# Patient Record
Sex: Female | Born: 1955 | Race: White | Hispanic: No | Marital: Married | Smoking: Never smoker
Health system: Southern US, Community
[De-identification: ages and names within clinical notes are randomized; demographics above are authoritative.]

---

## 2005-09-04 ENCOUNTER — Other Ambulatory Visit: Admission: RE | Admit: 2005-09-04 | Discharge: 2005-09-04 | Payer: Self-pay | Admitting: Gynecology

## 2005-09-11 ENCOUNTER — Encounter: Admission: RE | Admit: 2005-09-11 | Discharge: 2005-09-11 | Payer: Self-pay | Admitting: Gynecology

## 2005-10-24 ENCOUNTER — Encounter: Admission: RE | Admit: 2005-10-24 | Discharge: 2005-12-10 | Payer: Self-pay | Admitting: Family Medicine

## 2006-09-08 ENCOUNTER — Other Ambulatory Visit: Admission: RE | Admit: 2006-09-08 | Discharge: 2006-09-08 | Payer: Self-pay | Admitting: Gynecology

## 2006-09-12 ENCOUNTER — Encounter: Admission: RE | Admit: 2006-09-12 | Discharge: 2006-09-12 | Payer: Self-pay | Admitting: Gynecology

## 2007-09-16 ENCOUNTER — Encounter: Admission: RE | Admit: 2007-09-16 | Discharge: 2007-09-16 | Payer: Self-pay | Admitting: Gynecology

## 2007-09-17 ENCOUNTER — Other Ambulatory Visit: Admission: RE | Admit: 2007-09-17 | Discharge: 2007-09-17 | Payer: Self-pay | Admitting: Gynecology

## 2008-02-08 ENCOUNTER — Encounter (INDEPENDENT_AMBULATORY_CARE_PROVIDER_SITE_OTHER): Payer: Self-pay | Admitting: Obstetrics and Gynecology

## 2008-02-08 ENCOUNTER — Ambulatory Visit (HOSPITAL_COMMUNITY): Admission: RE | Admit: 2008-02-08 | Discharge: 2008-02-08 | Payer: Self-pay | Admitting: Obstetrics and Gynecology

## 2008-10-18 ENCOUNTER — Encounter: Admission: RE | Admit: 2008-10-18 | Discharge: 2008-10-18 | Payer: Self-pay | Admitting: Obstetrics and Gynecology

## 2008-10-21 ENCOUNTER — Encounter: Admission: RE | Admit: 2008-10-21 | Discharge: 2008-10-21 | Payer: Self-pay | Admitting: Obstetrics and Gynecology

## 2009-10-24 ENCOUNTER — Encounter: Admission: RE | Admit: 2009-10-24 | Discharge: 2009-10-24 | Payer: Self-pay | Admitting: Obstetrics and Gynecology

## 2010-07-15 ENCOUNTER — Encounter: Payer: Self-pay | Admitting: Obstetrics and Gynecology

## 2010-10-08 ENCOUNTER — Other Ambulatory Visit: Payer: Self-pay | Admitting: Obstetrics and Gynecology

## 2010-10-08 DIAGNOSIS — Z1231 Encounter for screening mammogram for malignant neoplasm of breast: Secondary | ICD-10-CM

## 2010-10-30 ENCOUNTER — Other Ambulatory Visit: Payer: Self-pay | Admitting: Obstetrics and Gynecology

## 2010-10-30 ENCOUNTER — Ambulatory Visit
Admission: RE | Admit: 2010-10-30 | Discharge: 2010-10-30 | Disposition: A | Discharge status: Home / Self Care | Payer: Federal, State, Local not specified - PPO | Source: Ambulatory Visit | Attending: Obstetrics and Gynecology | Admitting: Obstetrics and Gynecology

## 2010-10-30 DIAGNOSIS — Z1231 Encounter for screening mammogram for malignant neoplasm of breast: Secondary | ICD-10-CM

## 2010-10-30 DIAGNOSIS — N63 Unspecified lump in unspecified breast: Secondary | ICD-10-CM

## 2010-11-05 ENCOUNTER — Other Ambulatory Visit: Payer: Self-pay | Admitting: Obstetrics and Gynecology

## 2010-11-05 ENCOUNTER — Ambulatory Visit
Admission: RE | Admit: 2010-11-05 | Discharge: 2010-11-05 | Disposition: A | Discharge status: Home / Self Care | Payer: Federal, State, Local not specified - PPO | Source: Ambulatory Visit | Attending: Obstetrics and Gynecology | Admitting: Obstetrics and Gynecology

## 2010-11-05 DIAGNOSIS — N63 Unspecified lump in unspecified breast: Secondary | ICD-10-CM

## 2010-11-06 NOTE — Op Note (Signed)
[name], [name]               ACCOUNT NO.:  [id number]   MEDICAL RECORD NO.:  [id number]          PATIENT TYPE:  AMB   LOCATION:  SDC                           FACILITY:  [facility]   PHYSICIAN:  Sherron Monday, MD        DATE OF BIRTH:  [birth year]   DATE OF PROCEDURE:  02/08/2008  DATE OF DISCHARGE:                               OPERATIVE REPORT   PREOPERATIVE DIAGNOSES:  Adenomyosis, uterine fibroids, endometrial  polyp.   POSTOPERATIVE DIAGNOSES:  Adenomyosis, uterine fibroids, endometrial  polyp.   PROCEDURE:  D&C, hysteroscopy with polypectomy.   SURGEON:  Sherron Monday, MD.   ANESTHESIA:  MAC with 20 mL of 1% lidocaine for local.   FINDINGS:  Normal size uterus with fundal intramural and intracavitary  fibroid as well as right-sided intramural/intracavitary  fibroid, the  small polyps were also noted, normal ostia.   PATHOLOGY:  Endometrial curettings and polyps to Pathology.   COMPLICATIONS:  None.   ESTIMATED BLOOD LOSS:  Minimal.   IV FLUIDS:  500 mL.   URINE OUTPUT:  In-and-out catheter at the start of the procedure.   DISPOSITION:  Stable to PACU.   DESCRIPTION OF PROCEDURE:  After informed consent was reviewed with the  patient including risks, benefits, and alternatives of surgical  procedure, she was transferred in stable condition to the OR and placed  on the table in supine position.  IV sedation was induced and found to  be adequate.  She was then placed in the [place] stirrups, prepped and  draped in the normal sterile fashion.  Using an open-sided speculum, her  cervix was easily visualized and grasped with a single-toothed  tenaculum.  Using the yellow disposable dilator, her cervix was dilated  to accommodate the sound.  Her uterus sounded to 8 cm.  It was then  dilated to 19-French to accommodate the diagnostic hysteroscope.  On  survey of the uterus, both ostia were easily located.  A fundal  intramural/intracavitary fibroid was noted as well as a  right-sided  intramural/intracavitary fibroid as well as noted small polyps.  The D&C  was performed with a serrated curette and again visualization was  performed with the hysteroscope, again delineating the areas with  fibroids.  In the process of the D&C, a gritty texture was noted in all  4 quadrants.  The uterine cavity was re-examined with the scope.  The  fibroids were noted to have been denuded off endometrium, further  verifying that they were intracavitary and intramural fibroids.  The  scope was removed.  The single-tooth  tenaculum was removed from the cervix.  Hemostasis was assured.  The  patient tolerated the procedure well.  Sponge, lap, and needle counts  were correct x2 at the end of the procedure.  She was returned in the  supine position awake and stable condition and transferred to the PACU.      Sherron Monday, MD  Electronically Signed     JB/MEDQ  D:  02/08/2008  T:  02/09/2008  Job:  [id number]

## 2010-11-06 NOTE — H&P (Signed)
[name], [name]               ACCOUNT NO.:  [id number]   MEDICAL RECORD NO.:  [id number]          Potts TYPE:  AMB   LOCATION:  SDC                           FACILITY:  [facility]   PHYSICIAN:  Sherron Monday, MD        DATE OF BIRTH:  Nov 11, 1955   DATE OF ADMISSION:  DATE OF DISCHARGE:                              HISTORY & PHYSICAL   PROCEDURE PLANNED:  D&C hysteroscopy.   ADMITTING DIAGNOSES:  Uterine polyps as well as fibroids.   HISTORY OF PRESENT ILLNESS:  A 55 year old, G2, P1-0-1-1, who presents  originally for a second opinion.  She has problems with discharge and  odor as well as uterine polyps versus fibroids and her options for  treatment.  She was previously seen by a Dr. Chevis Potts and then referred to  Dr. Randell Potts who became ill and then she was sent to Dr. Nicholas Potts as she just  wanted a second opinion.  She has known endometrial polyps seen by  saline-infused sonohysterogram.  She also has uterine fibroids.  However, she is having no symptoms of these.  On her ultrasound,  adenomyosis has been noted and the Potts was being prepared to have a  hysterectomy and she is not wanting to have hysterectomy.  On her workup  at our office, she had a saline-infused sonohysterogram, which revealed  fibroids with questionable adenomyosis as well as a normal right ovary,  left ovary was not seen, and questionable uterine polyps were also  noted.   PAST MEDICAL HISTORY:  Significant for pyelonephritis in 2003, as well  as asthma, as well as a questionable history of thyroid disease.   PAST SURGICAL HISTORY:  Significant for a low-transverse cesarean  section as well as knee surgery.   ALLERGIES:  SULFA.   MEDICATIONS:  Multivitamins and fish oil.   SOCIAL HISTORY:  Denies alcohol, tobacco, or drug use.  She is married  and divorced and remarried for 7 years.  She is an Database administrator.  She has a history of an abnormal Pap smear in the past, but the repeat  was normal, her last was  performed in March 2009 and was also found to  be normal.  No history of any sexually transmitted diseases.  Menarche  was at age 52.  Irregular cycles, approximately every 3 months for 6  days.  She describes them as heavy.  She is unsure of the time when her  mother went into menopause.  She currently does not complain of any hot  flashes or night sweats.  She is sexually active and monogamous with a  single female partner, nothing for contraception.  No intermenstrual  bleeding.  Some discharge.  No itch.  No problems with dyspareunia.  She  is a G2, P1-0-1-1 with a G1 being a miscarriage G2, being a low-  transverse cesarean section in 1994 of a 9-pound 2-ounce female infant.  Her mammograms are up-to-date.   FAMILY HISTORY:  Significant for pancreatic cancer in her father, breast  cancer in the maternal grandmother in her 11s.  She has no history of  diabetes or hypertension.   PHYSICAL EXAMINATION:  GENERAL:  No apparent distress.  VITAL SIGNS:  She is afebrile with vital signs stable, 5 feet 6 inches  tall with a 187.5 pounds.  CARDIOVASCULAR:  Regular rate and rhythm.  LUNGS: Clear to auscultation bilaterally.  ABDOMEN:  Soft, nontender, and nondistended.  EXTREMITIES:  Symmetric and nontender.  HEENT:  Mucous membranes are moist.  NECK:  No lymphadenopathy.  BACK:  No costovertebral angle tenderness.  PELVIC:  Normal external female genitalia.  Normal Bartholin, urethral,  and Skene glands, good support.  Cervix and vagina without lesions.  No  cervical motion tenderness.  Normal uterus, which is retroverted.  Adnexa, no masses and nontender.  BREAST:  Deferred.   ASSESSMENT AND PLAN:  A 55 year old, G2, P1-0-1-1 who presents with  known uterine polyps as well as a questionable adenomyosis for surgical  options for treatment.  Discussed with her D&C and hysteroscopy versus  hysterectomy.  The Potts opted to have D&C and hysteroscopy as she is  having no uterine symptoms.   We discussed the risks, benefits, and  alternatives of the surgical procedure.  She voices understanding to  this and wishes to proceed.  We will proceed on February 08, 2008, with a  D&C and hysteroscopy.       Sherron Monday, MD  Electronically Signed     JB/MEDQ  D:  02/05/2008  T:  02/06/2008  Job:  [id number]

## 2011-03-22 LAB — CBC
Hemoglobin: 13.5
Platelets: 203
RBC: 4.37
WBC: 5.9

## 2011-03-22 LAB — PREGNANCY, URINE: Preg Test, Ur: NEGATIVE

## 2011-11-22 ENCOUNTER — Other Ambulatory Visit: Payer: Self-pay | Admitting: Obstetrics and Gynecology

## 2011-11-22 DIAGNOSIS — Z1231 Encounter for screening mammogram for malignant neoplasm of breast: Secondary | ICD-10-CM

## 2011-12-06 ENCOUNTER — Ambulatory Visit
Admission: RE | Admit: 2011-12-06 | Discharge: 2011-12-06 | Disposition: A | Discharge status: Home / Self Care | Payer: Federal, State, Local not specified - PPO | Source: Ambulatory Visit | Attending: Obstetrics and Gynecology | Admitting: Obstetrics and Gynecology

## 2011-12-06 DIAGNOSIS — Z1231 Encounter for screening mammogram for malignant neoplasm of breast: Secondary | ICD-10-CM

## 2012-01-29 ENCOUNTER — Other Ambulatory Visit: Payer: Self-pay

## 2012-11-20 ENCOUNTER — Other Ambulatory Visit: Payer: Self-pay | Admitting: Family Medicine

## 2012-11-20 DIAGNOSIS — M25562 Pain in left knee: Secondary | ICD-10-CM

## 2012-11-23 ENCOUNTER — Other Ambulatory Visit: Payer: Self-pay

## 2012-11-23 DIAGNOSIS — Z1231 Encounter for screening mammogram for malignant neoplasm of breast: Secondary | ICD-10-CM

## 2012-11-25 ENCOUNTER — Other Ambulatory Visit: Payer: Federal, State, Local not specified - PPO

## 2012-12-01 ENCOUNTER — Ambulatory Visit
Admission: RE | Admit: 2012-12-01 | Discharge: 2012-12-01 | Disposition: A | Discharge status: Home / Self Care | Payer: Federal, State, Local not specified - PPO | Source: Ambulatory Visit | Attending: Family Medicine | Admitting: Family Medicine

## 2012-12-01 DIAGNOSIS — M25562 Pain in left knee: Secondary | ICD-10-CM

## 2012-12-10 ENCOUNTER — Ambulatory Visit: Payer: Federal, State, Local not specified - PPO

## 2012-12-28 ENCOUNTER — Ambulatory Visit: Payer: Federal, State, Local not specified - PPO

## 2013-01-11 ENCOUNTER — Ambulatory Visit
Admission: RE | Admit: 2013-01-11 | Discharge: 2013-01-11 | Disposition: A | Discharge status: Home / Self Care | Payer: Federal, State, Local not specified - PPO | Source: Ambulatory Visit

## 2013-01-11 DIAGNOSIS — Z1231 Encounter for screening mammogram for malignant neoplasm of breast: Secondary | ICD-10-CM

## 2013-08-25 DIAGNOSIS — Z566 Other physical and mental strain related to work: Secondary | ICD-10-CM | POA: Insufficient documentation

## 2013-12-10 ENCOUNTER — Other Ambulatory Visit: Payer: Self-pay

## 2013-12-10 DIAGNOSIS — Z1231 Encounter for screening mammogram for malignant neoplasm of breast: Secondary | ICD-10-CM

## 2014-01-21 ENCOUNTER — Encounter (INDEPENDENT_AMBULATORY_CARE_PROVIDER_SITE_OTHER): Payer: Self-pay

## 2014-01-21 ENCOUNTER — Ambulatory Visit
Admission: RE | Admit: 2014-01-21 | Discharge: 2014-01-21 | Disposition: A | Discharge status: Home / Self Care | Payer: Federal, State, Local not specified - PPO | Source: Ambulatory Visit

## 2014-01-21 DIAGNOSIS — Z1231 Encounter for screening mammogram for malignant neoplasm of breast: Secondary | ICD-10-CM

## 2014-08-12 ENCOUNTER — Other Ambulatory Visit: Payer: Self-pay | Admitting: Podiatry

## 2016-02-13 IMAGING — MG MM SCREENING BREAST TOMO BILATERAL
9 of 12 series · 9 of 28 positions shown · non-contrast
Comparison: Previous exam(s).

CLINICAL DATA: Screening.

EXAM:
DIGITAL SCREENING BILATERAL MAMMOGRAM WITH 3D TOMO WITH CAD

[R CC synth-2D]
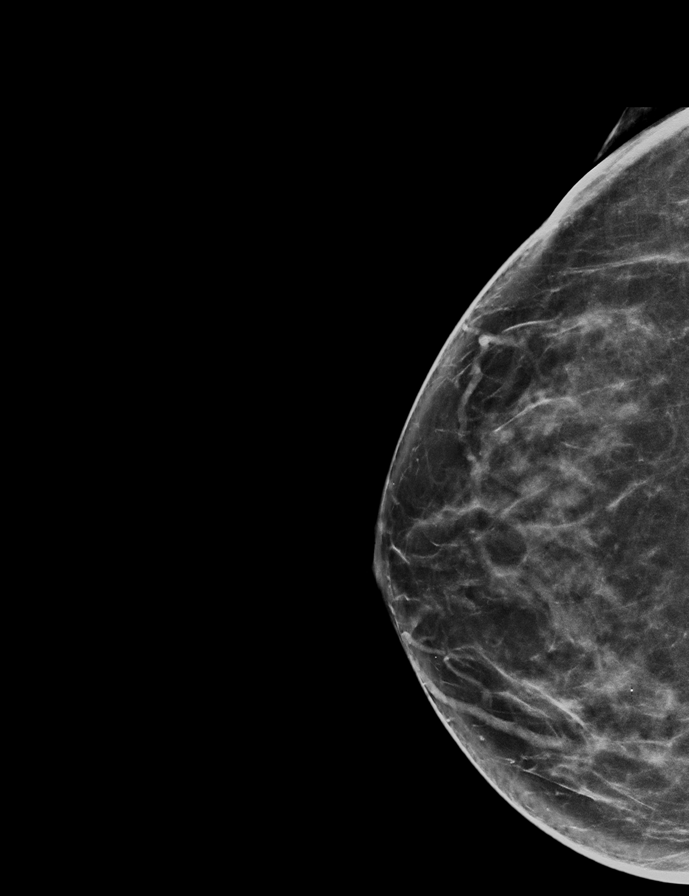

[L MLO synth-2D]
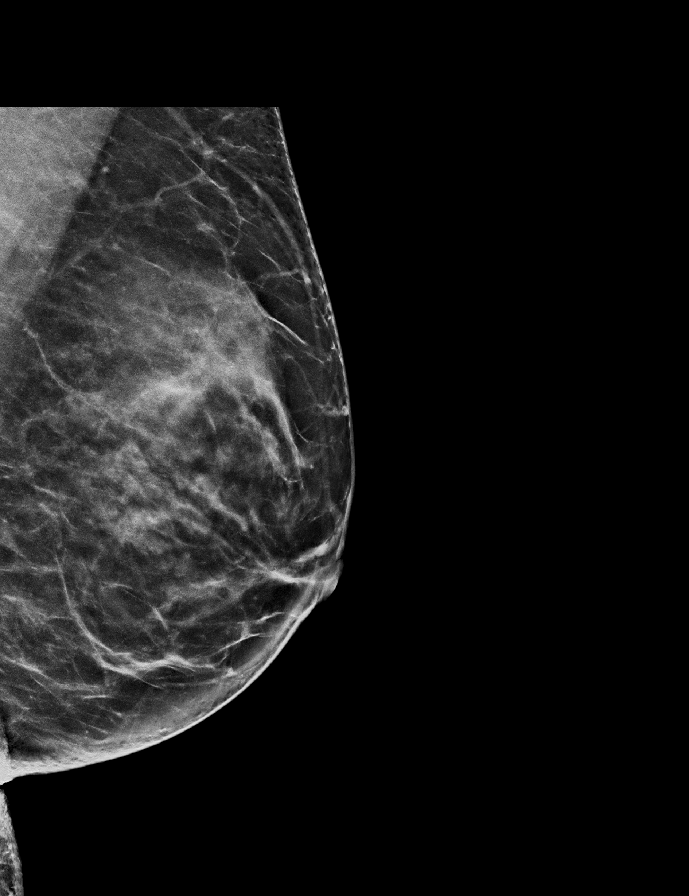

[L MLO]
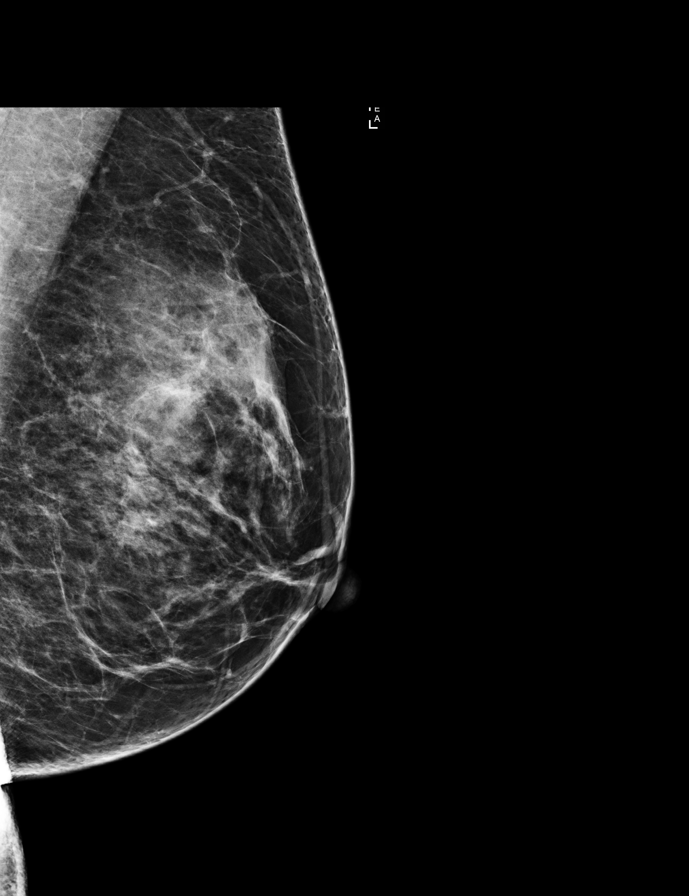

[L CC synth-2D]
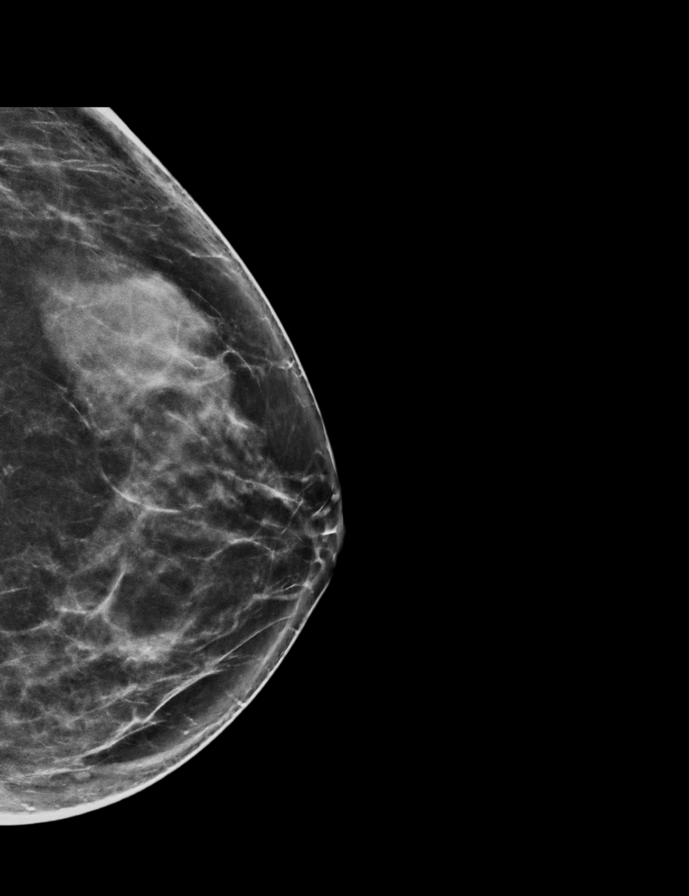

[R MLO synth-2D]
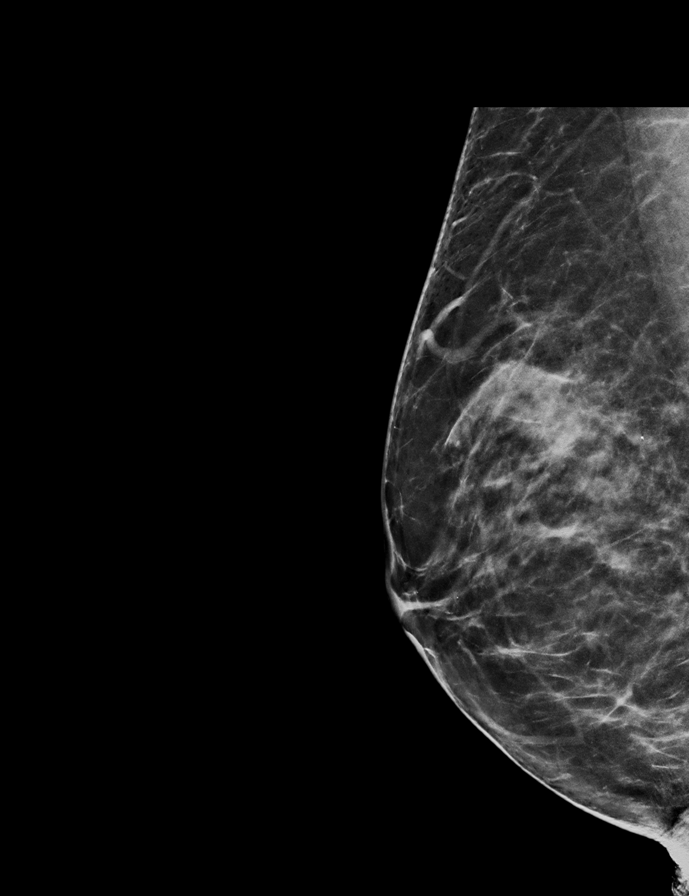

[R MLO]
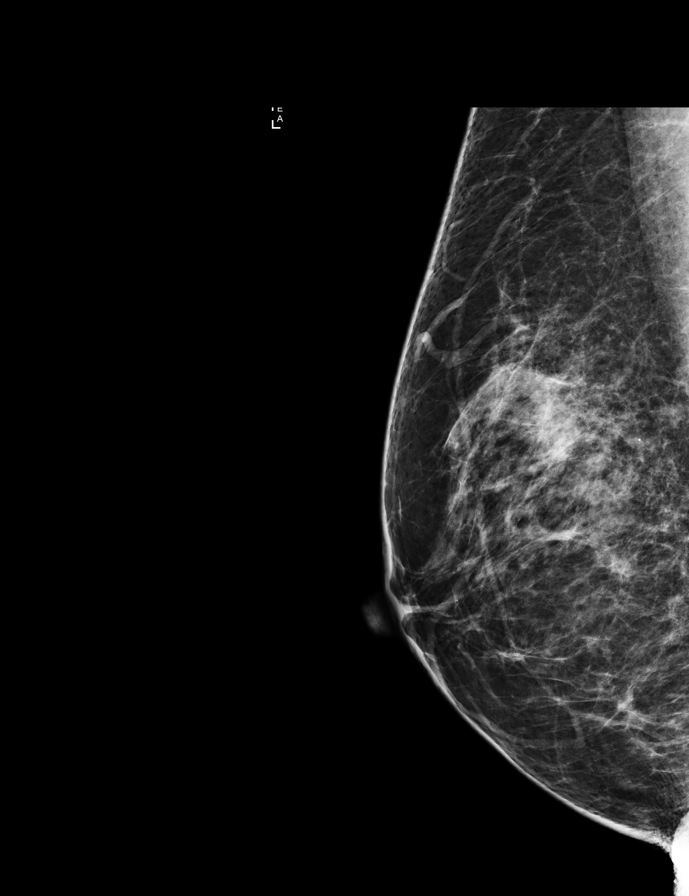

[L CC]
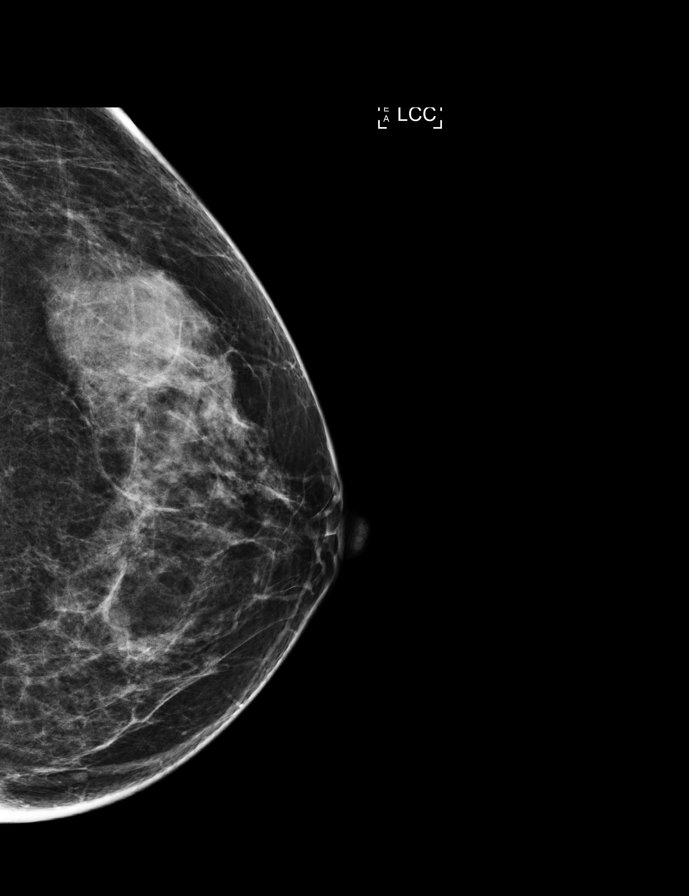

[R CC]
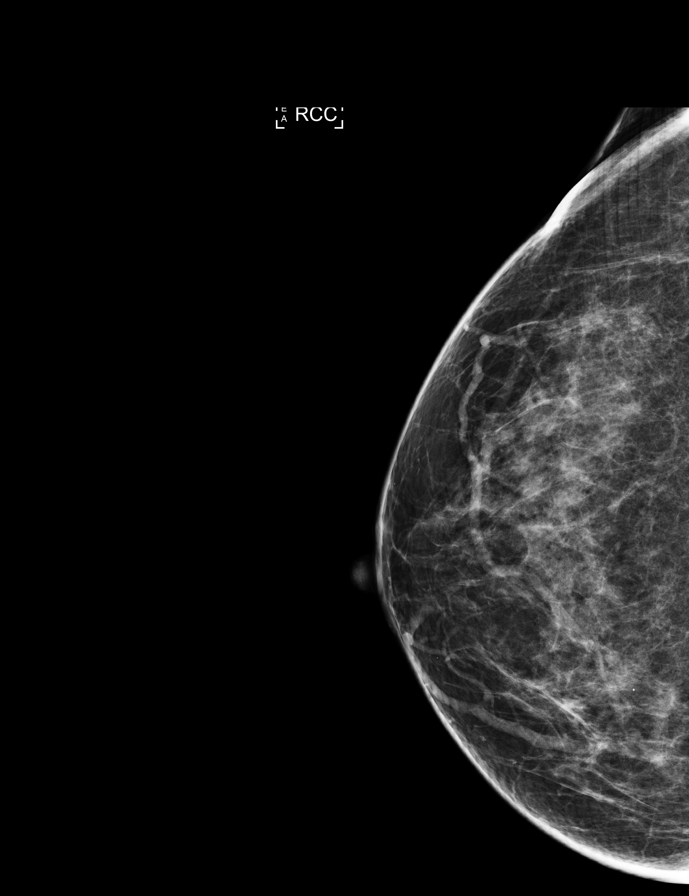

[L CC tomo · tomo slice 35/70.0]
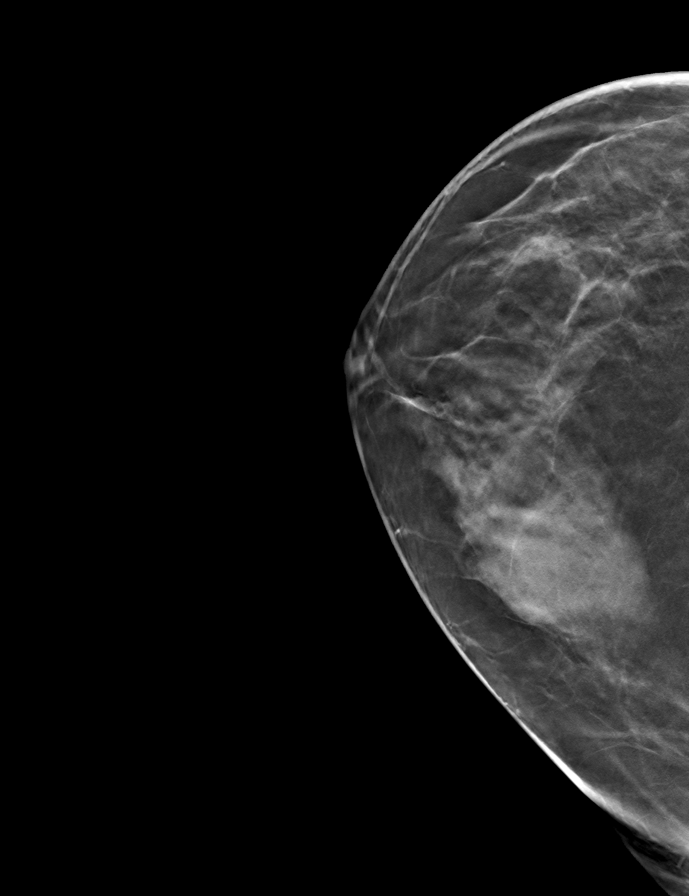

[9 of 28 positions shown; findings below may reference images not displayed]

ACR Breast Density Category c: The breast tissue is heterogeneously
dense, which may obscure small masses.
FINDINGS: There are no findings suspicious for malignancy. Images were
processed with CAD.
IMPRESSION: No mammographic evidence of malignancy. A result letter of this
screening mammogram will be mailed directly to the patient.

RECOMMENDATION:
Screening mammogram in one year. (Code:OA-G-1SS)

BI-RADS CATEGORY  1: Negative.

## 2016-04-15 ENCOUNTER — Ambulatory Visit (INDEPENDENT_AMBULATORY_CARE_PROVIDER_SITE_OTHER): Admitting: Orthopedic Surgery

## 2016-04-15 ENCOUNTER — Ambulatory Visit (INDEPENDENT_AMBULATORY_CARE_PROVIDER_SITE_OTHER): Payer: Self-pay

## 2016-04-15 DIAGNOSIS — M8580 Other specified disorders of bone density and structure, unspecified site: Secondary | ICD-10-CM | POA: Insufficient documentation

## 2016-04-15 DIAGNOSIS — G8929 Other chronic pain: Secondary | ICD-10-CM | POA: Diagnosis not present

## 2016-04-15 DIAGNOSIS — M25562 Pain in left knee: Secondary | ICD-10-CM

## 2016-04-15 DIAGNOSIS — M25462 Effusion, left knee: Secondary | ICD-10-CM | POA: Diagnosis not present

## 2016-04-15 DIAGNOSIS — Z85828 Personal history of other malignant neoplasm of skin: Secondary | ICD-10-CM | POA: Insufficient documentation

## 2016-04-15 DIAGNOSIS — K635 Polyp of colon: Secondary | ICD-10-CM | POA: Insufficient documentation

## 2016-04-15 NOTE — Progress Notes (Signed)
Office Visit Note   Patient: [name]           Date of Birth: 1956-01-16           MRN: [id number] Visit Date: 04/15/2016              Requested by: No referring provider defined for this encounter. PCP: Vernona Rieger, MD   Assessment & Plan: Visit Diagnoses:  1. Chronic pain of left knee   2. Swelling of left knee joint     Plan: Impression is left knee pain with progressive medial joint space narrowing and mildly symptomatic arthritis.  Plan continue nonweightbearing quad strengthening exercises.  Next step up would be anti-inflammatory medication which she does not want to try at this time.  From there we can proceed to cortisone/gel injections and those are described to the patient.  I advised her that if her pain increases about 20 or 25% she should consider gel injection or cortisone injection for pain management.  She may be slowly progressing towards the need for knee replacement but at this point she is not really in the ballpark for that procedure.  Follow-up with me as needed Follow-Up Instructions: Return if symptoms worsen or fail to improve.   Orders:  Orders Placed This Encounter  Procedures  . XR Knee 1-2 Views Left   Meds ordered this encounter  Medications  . amoxicillin (AMOXIL) 875 MG tablet    Sig: Take 875 mg by mouth 2 (two) times daily.      Procedures: No procedures performed   Clinical Data: No additional findings.   Subjective: Chief Complaint  Patient presents with  . Left Knee - Pain, Leg Swelling    Patient is complaining of left knee pain, medail and lateral, swelling lateral/anterior knee.  IT band tight, and hamstring hurts/tight.  Has been going to PT since 03/2013 at [facility].  Also has HEP, stretches that she does at home.  S/p left knee DOA, PMM on 03/22/2013.  Left knee feels like it wants to give way.  Grinds at times.  Is getting better with stairs, goes up them better than down them.  Takes no meds for this.      Patient has been in physical therapy 1 time a week.  She does describe occasional swelling but per describes primarily medial sided pain in the left knee.  She is in physical therapy.  She's doing nonweightbearing exercises.  She's not taking any medication for the problem.  Review of Systems all systems reviewed negative except for the left knee.  Denies any fever or chills.   Objective: Vital Signs: There were no vitals taken for this visit.  Physical Exam on exam she is well-developed well-nourished neck distress alert and oriented normal rest for effort normal heart rate normal skin no lymphadenopathy normal affect  Ortho Exam examination of the left knee demonstrates palpable pedal pulses no groin pain with internal/external rotation of the leg slight flexion traction about 5 compared to the right-hand side collateral cruciate ligaments are stable medial joint line tenderness is present compression testing equivocal  Specialty Comments:  No specialty comments available.  Imaging: Xr Knee 1-2 Views Left  Result Date: 04/15/2016 2 views left knee ordered and obtained shows medial joint space narrowing maintenance of the lateral and patellofemoral joint space with mild varus angulation.  Bones otherwise appear normal in the tibia and femur    PMFS History: Patient Active Problem List   Diagnosis Date Noted  .  Colon polyp 04/15/2016  . History of skin cancer 04/15/2016  . Osteopenia 04/15/2016  . Stressful workplace 08/25/2013   No past medical history on file.  No family history on file.  No past surgical history on file. Social History   Occupational History  . Not on file.   Social History Main Topics  . Smoking status: Not on file  . Smokeless tobacco: Not on file  . Alcohol use Not on file  . Drug use: Unknown  . Sexual activity: Not on file

## 2016-10-03 ENCOUNTER — Telehealth (INDEPENDENT_AMBULATORY_CARE_PROVIDER_SITE_OTHER): Payer: Self-pay | Admitting: Orthopedic Surgery

## 2016-10-03 NOTE — Telephone Encounter (Signed)
Received incomplete medical records release form from patient, the "date range" was not fill in. I highlighted area and mailed form to patient.

## 2016-10-09 ENCOUNTER — Encounter (INDEPENDENT_AMBULATORY_CARE_PROVIDER_SITE_OTHER): Payer: Self-pay

## 2017-03-24 ENCOUNTER — Ambulatory Visit (INDEPENDENT_AMBULATORY_CARE_PROVIDER_SITE_OTHER): Admitting: Orthopedic Surgery

## 2017-03-24 ENCOUNTER — Encounter (INDEPENDENT_AMBULATORY_CARE_PROVIDER_SITE_OTHER): Payer: Self-pay | Admitting: Orthopedic Surgery

## 2017-03-24 DIAGNOSIS — M1712 Unilateral primary osteoarthritis, left knee: Secondary | ICD-10-CM | POA: Diagnosis not present

## 2017-03-27 NOTE — Progress Notes (Signed)
   Office Visit Note   Patient: [name]           Date of Birth: 06/23/1956           MRN: [id number] Visit Date: 03/24/2017 Requested by: Vernona Rieger, MD No address on file PCP: Vernona Rieger, MD  Subjective: Chief Complaint  Patient presents with  . Left Knee - Pain    HPI: [name] is a patient here for follow-up of left knee pain.  She describes swelling and pain in the knee.  She does her best with biking swimming and yoga and avoids loadbearing activity.  She [facility]  She reports some lateral sided knee pain.  She is unable to walk or be on her feet for extended periods of time.  She has tried prolotherapy which may or may not be helping.  She wants to stay away from medication as a rule.              ROS: All systems reviewed are negative as they relate to the chief complaint within the history of present illness.  Patient denies  fevers or chills.   Assessment & Plan: Visit Diagnoses:  1. Unilateral primary osteoarthritis, left knee     Plan: Impression is left knee pain with potential component of both degenerative arthritis as well as iliotibial band bursitis.  If the prolotherapy doesn't work she would be a candidate for Synvisc injection and possible iliotibial band injection between the iliotibial band and capsule.  She'll see how the prolotherapy works.  In the meantime I will refill and reinstitute her physical therapy.  Follow-up with me as needed when she wants to try a gel injection  Follow-Up Instructions: No Follow-up on file.   Orders:  No orders of the defined types were placed in this encounter.  No orders of the defined types were placed in this encounter.     Procedures: No procedures performed   Clinical Data: No additional findings.  Objective: Vital Signs: There were no vitals taken for this visit.  Physical Exam:   Constitutional: Patient appears well-developed HEENT:    Head: Normocephalic Eyes:EOM are normal Neck: Normal range of motion Cardiovascular: Normal rate Pulmonary/chest: Effort normal Neurologic: Patient is alert Skin: Skin is warm Psychiatric: Patient has normal mood and affect    Ortho Exam: Orthopedic exam demonstrates pretty normal gait and alignment palpable pedal pulses no groin pain with internal/external rotation of either knee noted knee effusion.  Medial and lateral joint line tenderness with some tenderness over the lateral condyle iliotibial band region.  Pedal pulses palpable.  No other masses lymph adenopathy or skin changes noted in the left knee region.  Range of motion is full collateral and cruciate ligaments stable  Specialty Comments:  No specialty comments available.  Imaging: No results found.   PMFS History: Patient Active Problem List   Diagnosis Date Noted  . Colon polyp 04/15/2016  . History of skin cancer 04/15/2016  . Osteopenia 04/15/2016  . Stressful workplace 08/25/2013   No past medical history on file.  No family history on file.  No past surgical history on file. Social History   Occupational History  . Not on file.   Social History Main Topics  . Smoking status: Never Smoker  . Smokeless tobacco: Never Used  . Alcohol use Not on file  . Drug use: Unknown  . Sexual activity: Not on file

## 2017-03-28 ENCOUNTER — Telehealth (INDEPENDENT_AMBULATORY_CARE_PROVIDER_SITE_OTHER): Payer: Self-pay

## 2017-03-28 ENCOUNTER — Telehealth (INDEPENDENT_AMBULATORY_CARE_PROVIDER_SITE_OTHER): Payer: Self-pay | Admitting: Orthopedic Surgery

## 2017-03-28 NOTE — Telephone Encounter (Signed)
Pt called about injections (WC)  Please call [phone]

## 2017-03-28 NOTE — Telephone Encounter (Signed)
Tried calling patient about monovisc injection. No answer. Left detailed VM for her advising per BV insurance would cover it but with following stipulations: Copay of $150.00 and patient responsible for 30% of the allowable amount. Once OOP was met would not have to pay but only met $1238.92 of her $5500 OOPMax LM for her to call us back and let us know how wished to proceed or if she had questions.

## 2017-03-28 NOTE — Telephone Encounter (Signed)
Dr August Saucer is wanting patient to get monovisc injection both knees. If not both, at least the left. Not sure if they will cover. She stated that this is from an injury from 06/2012. Should be [place] [employer]. Can you see if they will authorize injection(s)

## 2017-03-31 NOTE — Telephone Encounter (Signed)
Dx- M17.0 CPT/Procedure-20610 Code for Monovisc- J7327 x 2

## 2017-04-02 NOTE — Telephone Encounter (Signed)
Faxed auth form with note to dept of labor for left knee only.

## 2017-04-02 NOTE — Telephone Encounter (Signed)
FYI

## 2017-04-28 NOTE — Telephone Encounter (Signed)
Called automated IVR system and no prior auths found for this pt. Refaxed auth form and notes to [phone].

## 2017-05-28 NOTE — Telephone Encounter (Signed)
appt scheduled for patient.

## 2017-06-04 ENCOUNTER — Ambulatory Visit (INDEPENDENT_AMBULATORY_CARE_PROVIDER_SITE_OTHER): Payer: Self-pay | Admitting: Orthopedic Surgery

## 2017-07-23 ENCOUNTER — Encounter (INDEPENDENT_AMBULATORY_CARE_PROVIDER_SITE_OTHER): Payer: Self-pay | Admitting: Orthopedic Surgery

## 2017-07-23 ENCOUNTER — Ambulatory Visit (INDEPENDENT_AMBULATORY_CARE_PROVIDER_SITE_OTHER): Admitting: Orthopedic Surgery

## 2017-07-23 DIAGNOSIS — M1712 Unilateral primary osteoarthritis, left knee: Secondary | ICD-10-CM | POA: Diagnosis not present

## 2017-07-25 ENCOUNTER — Encounter (INDEPENDENT_AMBULATORY_CARE_PROVIDER_SITE_OTHER): Payer: Self-pay | Admitting: Orthopedic Surgery

## 2017-07-25 NOTE — Progress Notes (Signed)
   Office Visit Note   Patient: Jennell CornerSusan Orellana           Date of Birth: 04/04/1956           MRN: [id number] Visit Date: 07/23/2017 Requested by: Vernona Riegerlark, Katherine, MD No address on file PCP: Vernona Riegerlark, Katherine, MD  Subjective: Chief Complaint  Patient presents with  . Left Knee - Follow-up    HPI: Darl PikesSusan is a patient with left knee pain.  She was going to receive Monovisc injection today because of some recurrent pain in her left knee.  However she has been doing well for the last 3 days.  A month ago her pain was level 7 out of 10.  She has been using Biofreeze and deep blue.  She is in therapy 2 times a week which helps her to maintain her strength.              ROS: All systems reviewed are negative as they relate to the chief complaint within the history of present illness.  Patient denies  fevers or chills.   Assessment & Plan: Visit Diagnoses:  1. Unilateral primary osteoarthritis, left knee     Plan: Impression is symptomatic arthritis in the left knee which has become less symptomatic over the past several days.  I would favor holding off on the Monovisc injection for 3-4 months until her knee becomes more symptomatic.  For now continue with therapy and home exercise program for quad strengthening.  Follow-up in 3-4 months for gel injection at that time  Follow-Up Instructions: Return in about 4 months (around 11/20/2017).   Orders:  No orders of the defined types were placed in this encounter.  No orders of the defined types were placed in this encounter.     Procedures: No procedures performed   Clinical Data: No additional findings.  Objective: Vital Signs: There were no vitals taken for this visit.  Physical Exam:   Constitutional: Patient appears well-developed HEENT:  Head: Normocephalic Eyes:EOM are normal Neck: Normal range of motion Cardiovascular: Normal rate Pulmonary/chest: Effort normal Neurologic: Patient is alert Skin: Skin is  warm Psychiatric: Patient has normal mood and affect    Ortho Exam: Orthopedic exam demonstrates no effusion in the left knee.  She is developing a slight flexion contracture.  Extensor mechanism is intact.  Collateral cruciate ligaments are stable.  Pedal pulses palpable.  Range of motion is about 7-120.  No groin pain with internal/external rotation of the leg.  Specialty Comments:  No specialty comments available.  Imaging: No results found.   PMFS History: Patient Active Problem List   Diagnosis Date Noted  . Colon polyp 04/15/2016  . History of skin cancer 04/15/2016  . Osteopenia 04/15/2016  . Stressful workplace 08/25/2013   History reviewed. No pertinent past medical history.  History reviewed. No pertinent family history.  History reviewed. No pertinent surgical history. Social History   Occupational History  . Not on file  Tobacco Use  . Smoking status: Never Smoker  . Smokeless tobacco: Never Used  Substance and Sexual Activity  . Alcohol use: Not on file  . Drug use: Not on file  . Sexual activity: Not on file

## 2017-10-20 ENCOUNTER — Encounter (INDEPENDENT_AMBULATORY_CARE_PROVIDER_SITE_OTHER): Payer: Self-pay | Admitting: Orthopedic Surgery

## 2017-10-20 ENCOUNTER — Ambulatory Visit (INDEPENDENT_AMBULATORY_CARE_PROVIDER_SITE_OTHER): Admitting: Orthopedic Surgery

## 2017-10-20 DIAGNOSIS — M1712 Unilateral primary osteoarthritis, left knee: Secondary | ICD-10-CM

## 2017-10-20 NOTE — Progress Notes (Signed)
Office Visit Note   Patient: [name]           Date of Birth: 06/23/1956           MRN: [id number] Visit Date: 10/20/2017 Requested by: Vernona Rieger, MD No address on file PCP: Vernona Rieger, MD  Subjective: Chief Complaint  Patient presents with  . Left Knee - Follow-up    HPI: [name] is a 62 year old patient with left knee pain.  She had arthroscopy about 5 years ago.  She is has some progressive arthritis based on radiographs done 2 years ago.  She is however doing well overall.  She is going to therapy once every couple of weeks.  Still has some swelling.  Painful under the patella with some occasional medial pain.  She is having a little bit more iliotibial band type pain on that lateral aspect of the left knee.  She is taking no medication for pain.              ROS: All systems reviewed are negative as they relate to the chief complaint within the history of present illness.  Patient denies  fevers or chills.   Assessment & Plan: Visit Diagnoses:  1. Unilateral primary osteoarthritis, left knee     Plan: Impression is well-functioning left knee with some evidence radiographically of progressive arthritis.  Currently there is no effusion.  I think she is doing well all things considered.  I encouraged her to do 3 things.  #1 stay as trim as possible #2 do not walk for exercise or do load bearing exercise #3 keep the leg muscle is strong as possible.  I will see her back in 6 months for repeat radiographs.  In general however the decision for further surgery will be in her hands and her court based on clinical symptoms.  She has no clinical symptoms right now.  Her physical therapy does not need to be continued.  Follow-Up Instructions: Return in about 6 months (around 04/21/2018).   Orders:  No orders of the defined types were placed in this encounter.  No orders of the defined types were placed in this encounter.     Procedures: No procedures  performed   Clinical Data: No additional findings.  Objective: Vital Signs: There were no vitals taken for this visit.  Physical Exam:   Constitutional: Patient appears well-developed HEENT:  Head: Normocephalic Eyes:EOM are normal Neck: Normal range of motion Cardiovascular: Normal rate Pulmonary/chest: Effort normal Neurologic: Patient is alert Skin: Skin is warm Psychiatric: Patient has normal mood and affect    Orthopedic exam demonstrates full active and passive range of motion Ortho Exam: Of the right knee.  Left knee does come out straight but lacks about 10 degrees of full flexion compared to the right.  There is no effusion in the left knee.  Pedal pulses palpable.  I do not detect any increase in swelling around the proximal tibial region left versus right.  Extensor mechanism is intact.  No real focal joint line tenderness.  I did show her how to do some iliotibial band stretching.  I think that would be helpful for this lateral sided left knee pain which really localizes more to the lateral condyle and not the joint line.  Specialty Comments:  No specialty comments available.  Imaging: No results found.   PMFS History: Patient Active Problem List   Diagnosis Date Noted  . Colon polyp 04/15/2016  . History of skin cancer 04/15/2016  . Osteopenia  04/15/2016  . Stressful workplace 08/25/2013   History reviewed. No pertinent past medical history.  History reviewed. No pertinent family history.  History reviewed. No pertinent surgical history. Social History   Occupational History  . Not on file  Tobacco Use  . Smoking status: Never Smoker  . Smokeless tobacco: Never Used  Substance and Sexual Activity  . Alcohol use: Not on file  . Drug use: Not on file  . Sexual activity: Not on file

## 2018-04-22 ENCOUNTER — Ambulatory Visit (INDEPENDENT_AMBULATORY_CARE_PROVIDER_SITE_OTHER): Payer: Self-pay | Admitting: Orthopedic Surgery

## 2018-04-30 ENCOUNTER — Ambulatory Visit (INDEPENDENT_AMBULATORY_CARE_PROVIDER_SITE_OTHER): Admitting: Orthopedic Surgery

## 2018-04-30 ENCOUNTER — Encounter (INDEPENDENT_AMBULATORY_CARE_PROVIDER_SITE_OTHER): Payer: Self-pay | Admitting: Orthopedic Surgery

## 2018-04-30 ENCOUNTER — Ambulatory Visit (INDEPENDENT_AMBULATORY_CARE_PROVIDER_SITE_OTHER)

## 2018-04-30 DIAGNOSIS — M25562 Pain in left knee: Secondary | ICD-10-CM

## 2018-04-30 NOTE — Progress Notes (Signed)
Office Visit Note   Patient: [name]           Date of Birth: 04-19-1956           MRN: [id number] Visit Date: 04/30/2018 Requested by: Vernona Rieger, MD No address on file PCP: Vernona Rieger, MD  Subjective: Chief Complaint  Patient presents with  . Left Knee - Follow-up    HPI: [name] is a patient with left knee pain.  In general she is doing well.  She has occasional issues with the left knee but she is not taking any medications.  She has noticed that the leg does not straighten fully at times.  She has no limitation of walking endurance.  She is still working.  Describes pain on the medial aspect of the knee only.              ROS: All systems reviewed are negative as they relate to the chief complaint within the history of present illness.  Patient denies  fevers or chills.   Assessment & Plan: Visit Diagnoses:  1. Left knee pain, unspecified chronicity     Plan: Impression is left knee medial compartment arthritis which is stable.  Clinically the patient is doing well.  We talked about gel injections at some time in the future but we will hold off on that until she is more symptomatic.  In general if she become symptomatic I like her to take anti-inflammatories first 5 to 7 days and if she remains symptomatic with the knee come in for gel injections.  I will see her back as needed.  Discussed appropriate exercises which are non-loadbearing to strengthen the quad.  Follow-Up Instructions: Return if symptoms worsen or fail to improve.   Orders:  Orders Placed This Encounter  Procedures  . XR KNEE 3 VIEW LEFT   No orders of the defined types were placed in this encounter.     Procedures: No procedures performed   Clinical Data: No additional findings.  Objective: Vital Signs: There were no vitals taken for this visit.  Physical Exam:   Constitutional: Patient appears well-developed HEENT:  Head: Normocephalic Eyes:EOM are normal Neck: Normal  range of motion Cardiovascular: Normal rate Pulmonary/chest: Effort normal Neurologic: Patient is alert Skin: Skin is warm Psychiatric: Patient has normal mood and affect    Ortho Exam: Ortho exam demonstrates full active and passive range of motion of that left knee with about a 5 degree flexion contracture.  No effusion is present.  Collateral and cruciate ligaments are stable.  No groin pain with internal extra rotation of the leg.  Specialty Comments:  No specialty comments available.  Imaging: Xr Knee 3 View Left  Result Date: 04/30/2018 AP lateral merchant left knee reviewed.  Medial compartment arthritis is present.  Patellofemoral and lateral compartments appear to be relatively spared.  Varus alignment noted.  No fracture dislocation present.    PMFS History: Patient Active Problem List   Diagnosis Date Noted  . Colon polyp 04/15/2016  . History of skin cancer 04/15/2016  . Osteopenia 04/15/2016  . Stressful workplace 08/25/2013   History reviewed. No pertinent past medical history.  History reviewed. No pertinent family history.  History reviewed. No pertinent surgical history. Social History   Occupational History  . Not on file  Tobacco Use  . Smoking status: Never Smoker  . Smokeless tobacco: Never Used  Substance and Sexual Activity  . Alcohol use: Not on file  . Drug use: Not on file  .  Sexual activity: Not on file
